# Patient Record
Sex: Male | Born: 1945 | Race: White | Hispanic: No | Marital: Single | Smoking: Current every day smoker
Health system: Southern US, Community
[De-identification: ages and names within clinical notes are randomized; demographics above are authoritative.]

---

## 1998-12-09 HISTORY — PX: HERNIA REPAIR: SHX51

## 2001-02-04 ENCOUNTER — Ambulatory Visit (HOSPITAL_BASED_OUTPATIENT_CLINIC_OR_DEPARTMENT_OTHER): Admission: RE | Admit: 2001-02-04 | Discharge: 2001-02-04 | Payer: Self-pay | Admitting: Surgery

## 2013-09-16 ENCOUNTER — Encounter: Payer: Self-pay | Admitting: Internal Medicine

## 2013-09-16 ENCOUNTER — Ambulatory Visit (INDEPENDENT_AMBULATORY_CARE_PROVIDER_SITE_OTHER): Payer: Medicare Other | Admitting: Internal Medicine

## 2013-09-16 VITALS — BP 140/98 | HR 64 | Ht 70.0 in | Wt 225.0 lb

## 2013-09-16 DIAGNOSIS — Z720 Tobacco use: Secondary | ICD-10-CM

## 2013-09-16 DIAGNOSIS — R5383 Other fatigue: Secondary | ICD-10-CM

## 2013-09-16 DIAGNOSIS — R002 Palpitations: Secondary | ICD-10-CM

## 2013-09-16 DIAGNOSIS — F17200 Nicotine dependence, unspecified, uncomplicated: Secondary | ICD-10-CM

## 2013-09-16 DIAGNOSIS — Z8249 Family history of ischemic heart disease and other diseases of the circulatory system: Secondary | ICD-10-CM

## 2013-09-16 DIAGNOSIS — R0989 Other specified symptoms and signs involving the circulatory and respiratory systems: Secondary | ICD-10-CM

## 2013-09-16 DIAGNOSIS — R5381 Other malaise: Secondary | ICD-10-CM

## 2013-09-16 DIAGNOSIS — E785 Hyperlipidemia, unspecified: Secondary | ICD-10-CM

## 2013-09-16 DIAGNOSIS — R0609 Other forms of dyspnea: Secondary | ICD-10-CM

## 2013-09-16 DIAGNOSIS — R0600 Dyspnea, unspecified: Secondary | ICD-10-CM

## 2013-09-16 NOTE — Patient Instructions (Signed)
Your physician has requested that you have a lexiscan myoview. For further information please visit [email or web address] Please follow instruction sheet, as given.  Please schedule a follow up visit after this test to review the results with Dr. Rennis Golden

## 2013-09-16 NOTE — Progress Notes (Signed)
OFFICE NOTE  Chief Complaint:  Fatigue, DOE  Primary Care Physician: No PCP Per Patient  HPI:  Richard Glover is a pleasant 67 year old male who recently saw Dr. Aleatha Borer at [facility]. He has a history of mild dyslipidemia and palpitations in the past. He has had some trouble with sleep and or depression and was previously taking Elavil. He was also on atenolol and pravastatin. In the past he reports he's had problems with atorvastatin causing muscle pains, and was placed on pravastatin which cause similar symptoms. He has since discontinued that medication. Around that time he is also noticed increasing fatigue and leg heaviness. He also has little energy to get short of breath very easily with little activity. The symptoms have come on over the past 8 months and he's noticed a marked decline. He feels like sleeping all the time and is not interested or motivated to go out to do any of the art work that he wants to do. He was recently seen and found to be in a sinus bradycardia with heart rates in the 50s. He discontinued atenolol and his heart rate has come up slightly into the 60s. In addition he stopped Elavil, but has not noticed any improvement in his symptoms. He has had evaluation of his testosterone levels which were normal. I thoroughly reviewed notes from Dr. Broadus John office including prior workup which was extensive and that included laboratory work, x-rays and an EKG in his office which I personally reviewed. His EKG today which I reviewed and interpreted shows a sinus rhythm at a rate of 64. Mr. Callender continues to smoke but he reports is very little. He does report a family history of heart disease in his father who was reportedly overweight, smoker and age a "country diet".  Despite this he is not describing any chest pain.  PMHx:  History reviewed. No pertinent past medical history.  Past Surgical History  Procedure Laterality Date  . Hernia repair  2000    FAMHx:    Family History  Problem Relation Age of Onset  . Liver cancer Mother   . Heart attack Father   . Cancer Maternal Grandfather     SOCHx:   reports that he has been smoking Cigarettes.  He has a 10 pack-year smoking history. He uses smokeless tobacco. He reports that he drinks alcohol. He reports that he does not use illicit drugs.  ALLERGIES:  Allergies  Allergen Reactions  . Atorvastatin Other (See Comments)    Myalgia   . Pravastatin Sodium Other (See Comments)    myalgia    ROS: A comprehensive review of systems was negative except for: Constitutional: positive for fatigue Respiratory: positive for dyspnea on exertion Cardiovascular: positive for palpitations  HOME MEDS: Current Outpatient Prescriptions  Medication Sig Dispense Refill  . aspirin 81 MG tablet Take 81 mg by mouth daily.      . fish oil-omega-3 fatty acids 1000 MG capsule Take 1 g by mouth daily.      . Multiple Vitamin (MULTIVITAMIN) capsule Take 1 capsule by mouth daily.      Marland Kitchen VITAMIN D, CHOLECALCIFEROL, PO Take by mouth daily.       No current facility-administered medications for this visit.    LABS/IMAGING: No results found for this or any previous visit (from the past 48 hour(s)). No results found.  VITALS: BP 140/98  Pulse 64  Ht 5\' 10"  (1.778 m)  Wt 225 lb (102.059 kg)  BMI 32.28 kg/m2  EXAM: General appearance: alert and no distress Neck: no carotid bruit and no JVD Lungs: clear to auscultation bilaterally Heart: regular rate and rhythm, S1, S2 normal, no murmur, click, rub or gallop Abdomen: soft, non-tender; bowel sounds normal; no masses,  no organomegaly and no pulsatile masses Extremities: extremities normal, atraumatic, no cyanosis or edema Pulses: 2+ and symmetric Skin: Skin color, texture, turgor normal. No rashes or lesions Neurologic: Grossly normal Psych: Mood, affect normal, pleasant, does not appear anxious  EKG: Normal sinus rhythm at 64 with normal axes and  intervals and no ischemic changes  ASSESSMENT: 1. Progressive worsening fatigue 2. Dyspnea on exertion 3. History of dyslipidemia-intolerant to statins 4. Tobacco abuse - not interested in quitting at this time 5. Obesity 6. Palpitations 7. Dyslipidemia- intolerant to statins  PLAN: 1.   Mr. Wigington has progressively worsening fatigue and has had a thorough workup through his primary care physician. The concern was that his heart rate was low in the 50s on atenolol, but that has come up significantly after stopping his beta blocker. He is currently not on medicine for that or for his dyslipidemia. In addition his father had heart disease and had a heart attack at age 53 and died from that. I'm concerned that Mr. Caudell progressive worsening fatigue is due to underlying coronary artery disease. There may be additionally chronotropic incompetence, and now that he is off the beta blocker would also be helpful to see what his exercise capacity is and heart rate response to exercise. I would recommend an exercise nuclear stress test to evaluate for any underlying coronary artery disease.  If this is negative, I feel that we need to continue to work on aggressive risk factor modification, including weight loss, smoking cessation and treatment of his abnormal cholesterol.  Thanks for referring him. I plan to see him back in 2 weeks to discuss the results of his stress test.  Chrystie Nose, MD, [facility] Attending Cardiologist [facility]  Katherin Ramey C 09/16/2013, 12:21 PM

## 2013-09-22 ENCOUNTER — Ambulatory Visit (HOSPITAL_COMMUNITY)
Admission: RE | Admit: 2013-09-22 | Discharge: 2013-09-22 | Disposition: A | Discharge status: Home / Self Care | Payer: Medicare Other | Source: Ambulatory Visit | Attending: Cardiology | Admitting: Cardiology

## 2013-09-22 DIAGNOSIS — R0989 Other specified symptoms and signs involving the circulatory and respiratory systems: Secondary | ICD-10-CM | POA: Insufficient documentation

## 2013-09-22 DIAGNOSIS — R0609 Other forms of dyspnea: Secondary | ICD-10-CM | POA: Insufficient documentation

## 2013-09-22 DIAGNOSIS — R5383 Other fatigue: Secondary | ICD-10-CM

## 2013-09-22 DIAGNOSIS — R0600 Dyspnea, unspecified: Secondary | ICD-10-CM

## 2013-09-22 DIAGNOSIS — F17200 Nicotine dependence, unspecified, uncomplicated: Secondary | ICD-10-CM

## 2013-09-22 DIAGNOSIS — R5381 Other malaise: Secondary | ICD-10-CM | POA: Insufficient documentation

## 2013-09-22 DIAGNOSIS — Z8249 Family history of ischemic heart disease and other diseases of the circulatory system: Secondary | ICD-10-CM | POA: Insufficient documentation

## 2013-09-22 DIAGNOSIS — E785 Hyperlipidemia, unspecified: Secondary | ICD-10-CM | POA: Insufficient documentation

## 2013-09-22 MED ORDER — TECHNETIUM TC 99M SESTAMIBI GENERIC - CARDIOLITE
30.4000 | Freq: Once | INTRAVENOUS | Status: AC | PRN
  Administered 2013-09-22: 30 via INTRAVENOUS

## 2013-09-22 MED ORDER — TECHNETIUM TC 99M SESTAMIBI GENERIC - CARDIOLITE
10.4000 | Freq: Once | INTRAVENOUS | Status: AC | PRN
  Administered 2013-09-22: 10 via INTRAVENOUS

## 2013-09-22 MED ORDER — REGADENOSON 0.4 MG/5ML IV SOLN
0.4000 mg | Freq: Once | INTRAVENOUS | Status: AC
  Administered 2013-09-22: 0.4 mg via INTRAVENOUS

## 2013-09-22 NOTE — Procedures (Addendum)
[facility] [address] [place] [place] [address] [phone]  Cardiology Nuclear Med Study  Richard Glover is a 67 y.o. male     MRN : [id number]     DOB: 1946/10/07  Procedure Date: 09/22/2013  Nuclear Med Background Indication for Stress Test:  Evaluation for Ischemia History:  No prior history reported. Cardiac Risk Factors: Family History - CAD, Lipids, Obesity and Smoker  Symptoms:  DOE, Fatigue, Palpitations, SOB and C/O Bil leg heaviness   Nuclear Pre-Procedure Caffeine/Decaff Intake:  1:00am NPO After: 11 AM   IV Site: R Hand  IV 0.9% NS with Angio Cath:  22g  Chest Size (in):  44"  IV Started by: Emmit Pomfret, RN  Height: 5\' 10"  (1.778 m)  Cup Size: n/a  BMI:  Body mass index is 32.28 kg/(m^2). Weight:  225 lb (102.059 kg)   Tech Comments:  n/a    Nuclear Med Study 1 or 2 day study: 1 day  Stress Test Type:  Lexiscan  Order Authorizing Provider:  Zoila Shutter, MD   Resting Radionuclide: Technetium 46m Sestamibi  Resting Radionuclide Dose: 10.4 mCi   Stress Radionuclide:  Technetium 58m Sestamibi  Stress Radionuclide Dose: 30.4 mCi           Stress Protocol Rest HR: 55 Stress HR: 95  Rest BP: 138/95 Stress BP: 153/94  Exercise Time (min): n/a METS: n/a          Dose of Adenosine (mg):  n/a Dose of Lexiscan: 0.4 mg  Dose of Atropine (mg): n/a Dose of Dobutamine: n/a mcg/kg/min (at max HR)  Stress Test Technologist: Ernestene Mention, CCT Nuclear Technologist: Gonzella Lex, CNMT   Rest Procedure:  Myocardial perfusion imaging was performed at rest 45 minutes following the intravenous administration of Technetium 81m Sestamibi. Stress Procedure:  The patient received IV Lexiscan 0.4 mg over 15-seconds.  Technetium 39m Sestamibi injected at 30-seconds.  There were no significant changes with Lexiscan.  Quantitative spect images were obtained after a 45 minute delay.  Transient Ischemic Dilatation (Normal  <1.22):  1.09 Lung/Heart Ratio (Normal <0.45):  0.35 QGS EDV:  97 ml QGS ESV:  40 ml LV Ejection Fraction: 59%  Signed by       Rest ECG: NSR - Normal EKG  Stress ECG: No significant ST segment change suggestive of ischemia; isolated PVC  QPS Raw Data Images:  Normal; no motion artifact; normal heart/lung ratio. Stress Images:  Normal homogeneous uptake in all areas of the myocardium. Rest Images:  Normal homogeneous uptake in all areas of the myocardium. Subtraction (SDS):  No evidence of ischemia.  Impression Exercise Capacity:  Lexiscan with no exercise. BP Response:  Normal blood pressure response. Clinical Symptoms:  Mild shortness of breath ECG Impression:  No significant ST segment change suggestive of ischemia. Comparison with Prior Nuclear Study: No images to compare  Overall Impression:  Normal stress nuclear study.  LV Wall Motion:  NL LV Function, EF 59%; NL Wall Motion   KELLY,THOMAS A, MD  09/22/2013 5:59 PM

## 2013-09-30 ENCOUNTER — Encounter: Payer: Self-pay | Admitting: Internal Medicine

## 2013-09-30 ENCOUNTER — Ambulatory Visit (INDEPENDENT_AMBULATORY_CARE_PROVIDER_SITE_OTHER): Payer: Medicare Other | Admitting: Internal Medicine

## 2013-09-30 VITALS — BP 130/84 | HR 64 | Ht 70.0 in | Wt 225.4 lb

## 2013-09-30 DIAGNOSIS — R0600 Dyspnea, unspecified: Secondary | ICD-10-CM

## 2013-09-30 DIAGNOSIS — R0609 Other forms of dyspnea: Secondary | ICD-10-CM

## 2013-09-30 DIAGNOSIS — R5383 Other fatigue: Secondary | ICD-10-CM

## 2013-09-30 DIAGNOSIS — E785 Hyperlipidemia, unspecified: Secondary | ICD-10-CM

## 2013-09-30 DIAGNOSIS — R002 Palpitations: Secondary | ICD-10-CM

## 2013-09-30 DIAGNOSIS — F17200 Nicotine dependence, unspecified, uncomplicated: Secondary | ICD-10-CM

## 2013-09-30 DIAGNOSIS — Z720 Tobacco use: Secondary | ICD-10-CM

## 2013-09-30 DIAGNOSIS — R0989 Other specified symptoms and signs involving the circulatory and respiratory systems: Secondary | ICD-10-CM

## 2013-09-30 DIAGNOSIS — R5381 Other malaise: Secondary | ICD-10-CM

## 2013-09-30 NOTE — Progress Notes (Signed)
OFFICE NOTE  Chief Complaint:  Fatigue, DOE  Primary Care Physician: No PCP Per Patient  HPI:  Richard Glover is a pleasant 67 year old male who recently saw Dr. Aleatha Glover at [facility]. He has a history of mild dyslipidemia and palpitations in the past. He has had some trouble with sleep and or depression and was previously taking Elavil. He was also on atenolol and pravastatin. In the past he reports he's had problems with atorvastatin causing muscle pains, and was placed on pravastatin which cause similar symptoms. He has since discontinued that medication. Around that time he is also noticed increasing fatigue and leg heaviness. He also has little energy to get short of breath very easily with little activity. The symptoms have come on over the past 8 months and he's noticed a marked decline. He feels like sleeping all the time and is not interested or motivated to go out to do any of the art work that he wants to do. He was recently seen and found to be in a sinus bradycardia with heart rates in the 50s. He discontinued atenolol and his heart rate has come up slightly into the 60s. In addition he stopped Elavil, but has not noticed any improvement in his symptoms. He has had evaluation of his testosterone levels which were normal. I thoroughly reviewed notes from Dr. Broadus Glover office including prior workup which was extensive and that included laboratory work, x-rays and an EKG in his office which I personally reviewed. His EKG today which I reviewed and interpreted shows a sinus rhythm at a rate of 64. Richard Glover continues to smoke but he reports is very little. He does report a family history of heart disease in his father who was reportedly overweight, smoker and age a "country diet".  Despite this he is not describing any chest pain.  Richard Glover underwent a nuclear stress test on 09/22/2013. This was negative for ischemia and consider low risk. EF was 59%. Since that stress test he started  to use some outdoor activities including sawing some wood with his friend. He was able to activities without market shortness of breath and feels it may be due to the cooler weather. He denies any significant palpitations.  PMHx:  History reviewed. No pertinent past medical history.  Past Surgical History  Procedure Laterality Date  . Hernia repair  2000    FAMHx:  Family History  Problem Relation Age of Onset  . Liver cancer Mother   . Heart attack Father   . Cancer Maternal Grandfather     SOCHx:   reports that he has been smoking Cigarettes.  He has a 10 pack-year smoking history. He uses smokeless tobacco. He reports that he drinks alcohol. He reports that he does not use illicit drugs.  ALLERGIES:  Allergies  Allergen Reactions  . Atorvastatin Other (See Comments)    Myalgia   . Pravastatin Sodium Other (See Comments)    myalgia  . Statins Other (See Comments)    Unspecified     ROS: A comprehensive review of systems was negative except for: Respiratory: positive for dyspnea on exertion  HOME MEDS: Current Outpatient Prescriptions  Medication Sig Dispense Refill  . aspirin 81 MG tablet Take 81 mg by mouth daily.      . fish oil-omega-3 fatty acids 1000 MG capsule Take 1 g by mouth daily.      . Multiple Vitamin (MULTIVITAMIN) capsule Take 1 capsule by mouth daily.      Marland Kitchen  VITAMIN D, CHOLECALCIFEROL, PO Take by mouth daily.       No current facility-administered medications for this visit.    LABS/IMAGING: No results found for this or any previous visit (from the past 48 hour(s)). No results found.  VITALS: BP 130/84  Pulse 64  Ht 5\' 10"  (1.778 m)  Wt 225 lb 6.4 oz (102.241 kg)  BMI 32.34 kg/m2  EXAM: deferred  EKG: Deferred  ASSESSMENT: 1. Progressive worsening fatigue - negative nuclear stress test, symptoms slightly improved 2. Dyspnea on exertion - negative nuclear stress test 3. History of dyslipidemia-intolerant to statins 4. Tobacco abuse  - not interested in quitting at this time 5. Obesity 6. Palpitations 7. Dyslipidemia- intolerant to statins  PLAN: 1.   Richard Glover  had a negative nuclear stress test with a preserved EF. His symptoms are slightly better it may be due to deconditioning or inactivity. I've encouraged him to get more activity and work on smoking cessation. He could have some early COPD and I recommended pulmonary function testing but he was not interested at this time. We'll plan to see him back annually or sooner as necessary.  Thanks again for referring him.  Richard Nose, MD, [facility] Attending Cardiologist [facility]  Richard Glover 09/30/2013, 1:18 PM

## 2013-09-30 NOTE — Patient Instructions (Signed)
Your physician wants you to follow-up in: 1 year. You will receive a reminder letter in the mail two months in advance. If you don't receive a letter, please call our office to schedule the follow-up appointment.  

## 2016-01-25 DIAGNOSIS — I10 Essential (primary) hypertension: Secondary | ICD-10-CM | POA: Diagnosis not present

## 2016-01-25 DIAGNOSIS — H18413 Arcus senilis, bilateral: Secondary | ICD-10-CM | POA: Diagnosis not present

## 2016-01-25 DIAGNOSIS — Z9849 Cataract extraction status, unspecified eye: Secondary | ICD-10-CM | POA: Diagnosis not present

## 2016-01-25 DIAGNOSIS — H35033 Hypertensive retinopathy, bilateral: Secondary | ICD-10-CM | POA: Diagnosis not present

## 2016-01-25 DIAGNOSIS — H11423 Conjunctival edema, bilateral: Secondary | ICD-10-CM | POA: Diagnosis not present

## 2016-01-25 DIAGNOSIS — H43813 Vitreous degeneration, bilateral: Secondary | ICD-10-CM | POA: Diagnosis not present

## 2016-01-25 DIAGNOSIS — H11153 Pinguecula, bilateral: Secondary | ICD-10-CM | POA: Diagnosis not present

## 2016-01-25 DIAGNOSIS — Z961 Presence of intraocular lens: Secondary | ICD-10-CM | POA: Diagnosis not present

## 2016-01-25 DIAGNOSIS — H40023 Open angle with borderline findings, high risk, bilateral: Secondary | ICD-10-CM | POA: Diagnosis not present

## 2016-01-25 DIAGNOSIS — H40003 Preglaucoma, unspecified, bilateral: Secondary | ICD-10-CM | POA: Diagnosis not present

## 2016-03-27 DIAGNOSIS — M545 Low back pain: Secondary | ICD-10-CM | POA: Diagnosis not present

## 2016-03-27 DIAGNOSIS — M9904 Segmental and somatic dysfunction of sacral region: Secondary | ICD-10-CM | POA: Diagnosis not present

## 2016-03-27 DIAGNOSIS — M9903 Segmental and somatic dysfunction of lumbar region: Secondary | ICD-10-CM | POA: Diagnosis not present

## 2016-03-27 DIAGNOSIS — M9905 Segmental and somatic dysfunction of pelvic region: Secondary | ICD-10-CM | POA: Diagnosis not present

## 2016-04-01 DIAGNOSIS — M9905 Segmental and somatic dysfunction of pelvic region: Secondary | ICD-10-CM | POA: Diagnosis not present

## 2016-04-01 DIAGNOSIS — M9904 Segmental and somatic dysfunction of sacral region: Secondary | ICD-10-CM | POA: Diagnosis not present

## 2016-04-01 DIAGNOSIS — M545 Low back pain: Secondary | ICD-10-CM | POA: Diagnosis not present

## 2016-04-01 DIAGNOSIS — M9903 Segmental and somatic dysfunction of lumbar region: Secondary | ICD-10-CM | POA: Diagnosis not present

## 2016-04-02 DIAGNOSIS — M9904 Segmental and somatic dysfunction of sacral region: Secondary | ICD-10-CM | POA: Diagnosis not present

## 2016-04-02 DIAGNOSIS — M9903 Segmental and somatic dysfunction of lumbar region: Secondary | ICD-10-CM | POA: Diagnosis not present

## 2016-04-02 DIAGNOSIS — M9905 Segmental and somatic dysfunction of pelvic region: Secondary | ICD-10-CM | POA: Diagnosis not present

## 2016-04-02 DIAGNOSIS — M545 Low back pain: Secondary | ICD-10-CM | POA: Diagnosis not present

## 2016-04-04 DIAGNOSIS — M545 Low back pain: Secondary | ICD-10-CM | POA: Diagnosis not present

## 2016-04-04 DIAGNOSIS — M9904 Segmental and somatic dysfunction of sacral region: Secondary | ICD-10-CM | POA: Diagnosis not present

## 2016-04-04 DIAGNOSIS — M9903 Segmental and somatic dysfunction of lumbar region: Secondary | ICD-10-CM | POA: Diagnosis not present

## 2016-04-04 DIAGNOSIS — M9905 Segmental and somatic dysfunction of pelvic region: Secondary | ICD-10-CM | POA: Diagnosis not present

## 2016-04-08 DIAGNOSIS — M9904 Segmental and somatic dysfunction of sacral region: Secondary | ICD-10-CM | POA: Diagnosis not present

## 2016-04-08 DIAGNOSIS — M545 Low back pain: Secondary | ICD-10-CM | POA: Diagnosis not present

## 2016-04-08 DIAGNOSIS — M9903 Segmental and somatic dysfunction of lumbar region: Secondary | ICD-10-CM | POA: Diagnosis not present

## 2016-04-08 DIAGNOSIS — M9905 Segmental and somatic dysfunction of pelvic region: Secondary | ICD-10-CM | POA: Diagnosis not present

## 2016-04-09 DIAGNOSIS — M9903 Segmental and somatic dysfunction of lumbar region: Secondary | ICD-10-CM | POA: Diagnosis not present

## 2016-04-09 DIAGNOSIS — M545 Low back pain: Secondary | ICD-10-CM | POA: Diagnosis not present

## 2016-04-09 DIAGNOSIS — M9904 Segmental and somatic dysfunction of sacral region: Secondary | ICD-10-CM | POA: Diagnosis not present

## 2016-04-09 DIAGNOSIS — M9905 Segmental and somatic dysfunction of pelvic region: Secondary | ICD-10-CM | POA: Diagnosis not present

## 2016-04-10 DIAGNOSIS — Z6831 Body mass index (BMI) 31.0-31.9, adult: Secondary | ICD-10-CM | POA: Diagnosis not present

## 2016-04-10 DIAGNOSIS — Z0000 Encounter for general adult medical examination without abnormal findings: Secondary | ICD-10-CM | POA: Diagnosis not present

## 2016-04-11 DIAGNOSIS — M9905 Segmental and somatic dysfunction of pelvic region: Secondary | ICD-10-CM | POA: Diagnosis not present

## 2016-04-11 DIAGNOSIS — M9904 Segmental and somatic dysfunction of sacral region: Secondary | ICD-10-CM | POA: Diagnosis not present

## 2016-04-11 DIAGNOSIS — M9903 Segmental and somatic dysfunction of lumbar region: Secondary | ICD-10-CM | POA: Diagnosis not present

## 2016-04-11 DIAGNOSIS — M545 Low back pain: Secondary | ICD-10-CM | POA: Diagnosis not present

## 2016-04-16 DIAGNOSIS — M9904 Segmental and somatic dysfunction of sacral region: Secondary | ICD-10-CM | POA: Diagnosis not present

## 2016-04-16 DIAGNOSIS — M9903 Segmental and somatic dysfunction of lumbar region: Secondary | ICD-10-CM | POA: Diagnosis not present

## 2016-04-16 DIAGNOSIS — M545 Low back pain: Secondary | ICD-10-CM | POA: Diagnosis not present

## 2016-04-16 DIAGNOSIS — M9905 Segmental and somatic dysfunction of pelvic region: Secondary | ICD-10-CM | POA: Diagnosis not present

## 2016-04-18 DIAGNOSIS — M9903 Segmental and somatic dysfunction of lumbar region: Secondary | ICD-10-CM | POA: Diagnosis not present

## 2016-04-18 DIAGNOSIS — M9904 Segmental and somatic dysfunction of sacral region: Secondary | ICD-10-CM | POA: Diagnosis not present

## 2016-04-18 DIAGNOSIS — M545 Low back pain: Secondary | ICD-10-CM | POA: Diagnosis not present

## 2016-04-18 DIAGNOSIS — M9905 Segmental and somatic dysfunction of pelvic region: Secondary | ICD-10-CM | POA: Diagnosis not present

## 2016-04-23 DIAGNOSIS — M9903 Segmental and somatic dysfunction of lumbar region: Secondary | ICD-10-CM | POA: Diagnosis not present

## 2016-04-23 DIAGNOSIS — M545 Low back pain: Secondary | ICD-10-CM | POA: Diagnosis not present

## 2016-04-23 DIAGNOSIS — M9904 Segmental and somatic dysfunction of sacral region: Secondary | ICD-10-CM | POA: Diagnosis not present

## 2016-04-23 DIAGNOSIS — M9905 Segmental and somatic dysfunction of pelvic region: Secondary | ICD-10-CM | POA: Diagnosis not present

## 2016-04-24 DIAGNOSIS — R69 Illness, unspecified: Secondary | ICD-10-CM | POA: Diagnosis not present

## 2016-04-25 DIAGNOSIS — M9905 Segmental and somatic dysfunction of pelvic region: Secondary | ICD-10-CM | POA: Diagnosis not present

## 2016-04-25 DIAGNOSIS — M9903 Segmental and somatic dysfunction of lumbar region: Secondary | ICD-10-CM | POA: Diagnosis not present

## 2016-04-25 DIAGNOSIS — M9904 Segmental and somatic dysfunction of sacral region: Secondary | ICD-10-CM | POA: Diagnosis not present

## 2016-04-25 DIAGNOSIS — M545 Low back pain: Secondary | ICD-10-CM | POA: Diagnosis not present

## 2016-05-08 DIAGNOSIS — M9903 Segmental and somatic dysfunction of lumbar region: Secondary | ICD-10-CM | POA: Diagnosis not present

## 2016-05-08 DIAGNOSIS — M545 Low back pain: Secondary | ICD-10-CM | POA: Diagnosis not present

## 2016-05-09 DIAGNOSIS — M9903 Segmental and somatic dysfunction of lumbar region: Secondary | ICD-10-CM | POA: Diagnosis not present

## 2016-05-09 DIAGNOSIS — M545 Low back pain: Secondary | ICD-10-CM | POA: Diagnosis not present

## 2016-05-10 DIAGNOSIS — M545 Low back pain: Secondary | ICD-10-CM | POA: Diagnosis not present

## 2016-05-10 DIAGNOSIS — M9903 Segmental and somatic dysfunction of lumbar region: Secondary | ICD-10-CM | POA: Diagnosis not present

## 2016-05-16 DIAGNOSIS — M9903 Segmental and somatic dysfunction of lumbar region: Secondary | ICD-10-CM | POA: Diagnosis not present

## 2016-05-16 DIAGNOSIS — M545 Low back pain: Secondary | ICD-10-CM | POA: Diagnosis not present

## 2016-05-30 DIAGNOSIS — M9903 Segmental and somatic dysfunction of lumbar region: Secondary | ICD-10-CM | POA: Diagnosis not present

## 2016-05-30 DIAGNOSIS — M545 Low back pain: Secondary | ICD-10-CM | POA: Diagnosis not present

## 2016-06-27 DIAGNOSIS — M9903 Segmental and somatic dysfunction of lumbar region: Secondary | ICD-10-CM | POA: Diagnosis not present

## 2016-06-27 DIAGNOSIS — M545 Low back pain: Secondary | ICD-10-CM | POA: Diagnosis not present

## 2016-08-01 DIAGNOSIS — M9903 Segmental and somatic dysfunction of lumbar region: Secondary | ICD-10-CM | POA: Diagnosis not present

## 2016-08-01 DIAGNOSIS — M545 Low back pain: Secondary | ICD-10-CM | POA: Diagnosis not present

## 2016-08-26 DIAGNOSIS — Z0000 Encounter for general adult medical examination without abnormal findings: Secondary | ICD-10-CM | POA: Diagnosis not present

## 2016-08-26 DIAGNOSIS — E784 Other hyperlipidemia: Secondary | ICD-10-CM | POA: Diagnosis not present

## 2016-08-26 DIAGNOSIS — Z125 Encounter for screening for malignant neoplasm of prostate: Secondary | ICD-10-CM | POA: Diagnosis not present

## 2016-09-02 DIAGNOSIS — E785 Hyperlipidemia, unspecified: Secondary | ICD-10-CM | POA: Diagnosis not present

## 2016-09-02 DIAGNOSIS — Z1389 Encounter for screening for other disorder: Secondary | ICD-10-CM | POA: Diagnosis not present

## 2016-09-02 DIAGNOSIS — Z23 Encounter for immunization: Secondary | ICD-10-CM | POA: Diagnosis not present

## 2016-09-02 DIAGNOSIS — Z86010 Personal history of colonic polyps: Secondary | ICD-10-CM | POA: Diagnosis not present

## 2016-09-02 DIAGNOSIS — Z1212 Encounter for screening for malignant neoplasm of rectum: Secondary | ICD-10-CM | POA: Diagnosis not present

## 2016-09-02 DIAGNOSIS — Z0000 Encounter for general adult medical examination without abnormal findings: Secondary | ICD-10-CM | POA: Diagnosis not present

## 2016-09-02 DIAGNOSIS — Z6831 Body mass index (BMI) 31.0-31.9, adult: Secondary | ICD-10-CM | POA: Diagnosis not present

## 2016-09-04 DIAGNOSIS — M9903 Segmental and somatic dysfunction of lumbar region: Secondary | ICD-10-CM | POA: Diagnosis not present

## 2016-09-04 DIAGNOSIS — M545 Low back pain: Secondary | ICD-10-CM | POA: Diagnosis not present

## 2016-10-01 DIAGNOSIS — M545 Low back pain: Secondary | ICD-10-CM | POA: Diagnosis not present

## 2016-10-01 DIAGNOSIS — M9903 Segmental and somatic dysfunction of lumbar region: Secondary | ICD-10-CM | POA: Diagnosis not present

## 2016-11-08 DIAGNOSIS — M545 Low back pain: Secondary | ICD-10-CM | POA: Diagnosis not present

## 2016-11-08 DIAGNOSIS — M9903 Segmental and somatic dysfunction of lumbar region: Secondary | ICD-10-CM | POA: Diagnosis not present

## 2016-11-13 DIAGNOSIS — R69 Illness, unspecified: Secondary | ICD-10-CM | POA: Diagnosis not present

## 2016-11-26 DIAGNOSIS — M9903 Segmental and somatic dysfunction of lumbar region: Secondary | ICD-10-CM | POA: Diagnosis not present

## 2016-11-26 DIAGNOSIS — M545 Low back pain: Secondary | ICD-10-CM | POA: Diagnosis not present

## 2016-11-28 DIAGNOSIS — M9903 Segmental and somatic dysfunction of lumbar region: Secondary | ICD-10-CM | POA: Diagnosis not present

## 2016-11-28 DIAGNOSIS — M545 Low back pain: Secondary | ICD-10-CM | POA: Diagnosis not present

## 2016-12-16 DIAGNOSIS — M9903 Segmental and somatic dysfunction of lumbar region: Secondary | ICD-10-CM | POA: Diagnosis not present

## 2016-12-16 DIAGNOSIS — M545 Low back pain: Secondary | ICD-10-CM | POA: Diagnosis not present

## 2017-01-13 DIAGNOSIS — R69 Illness, unspecified: Secondary | ICD-10-CM | POA: Diagnosis not present

## 2017-01-13 DIAGNOSIS — E669 Obesity, unspecified: Secondary | ICD-10-CM | POA: Diagnosis not present

## 2017-01-13 DIAGNOSIS — Z0000 Encounter for general adult medical examination without abnormal findings: Secondary | ICD-10-CM | POA: Diagnosis not present

## 2017-01-13 DIAGNOSIS — Z79899 Other long term (current) drug therapy: Secondary | ICD-10-CM | POA: Diagnosis not present

## 2017-01-13 DIAGNOSIS — K229 Disease of esophagus, unspecified: Secondary | ICD-10-CM | POA: Diagnosis not present

## 2017-01-13 DIAGNOSIS — Z6831 Body mass index (BMI) 31.0-31.9, adult: Secondary | ICD-10-CM | POA: Diagnosis not present

## 2017-01-13 DIAGNOSIS — R590 Localized enlarged lymph nodes: Secondary | ICD-10-CM | POA: Diagnosis not present

## 2017-01-13 DIAGNOSIS — M545 Low back pain: Secondary | ICD-10-CM | POA: Diagnosis not present

## 2017-01-13 DIAGNOSIS — R002 Palpitations: Secondary | ICD-10-CM | POA: Diagnosis not present

## 2017-01-15 DIAGNOSIS — Z6831 Body mass index (BMI) 31.0-31.9, adult: Secondary | ICD-10-CM | POA: Diagnosis not present

## 2017-01-15 DIAGNOSIS — R221 Localized swelling, mass and lump, neck: Secondary | ICD-10-CM | POA: Diagnosis not present

## 2017-01-16 DIAGNOSIS — M9903 Segmental and somatic dysfunction of lumbar region: Secondary | ICD-10-CM | POA: Diagnosis not present

## 2017-01-16 DIAGNOSIS — M545 Low back pain: Secondary | ICD-10-CM | POA: Diagnosis not present

## 2017-01-16 DIAGNOSIS — M9904 Segmental and somatic dysfunction of sacral region: Secondary | ICD-10-CM | POA: Diagnosis not present

## 2017-01-16 DIAGNOSIS — M9905 Segmental and somatic dysfunction of pelvic region: Secondary | ICD-10-CM | POA: Diagnosis not present

## 2017-01-23 DIAGNOSIS — H04123 Dry eye syndrome of bilateral lacrimal glands: Secondary | ICD-10-CM | POA: Diagnosis not present

## 2017-01-23 DIAGNOSIS — I10 Essential (primary) hypertension: Secondary | ICD-10-CM | POA: Diagnosis not present

## 2017-01-23 DIAGNOSIS — H43813 Vitreous degeneration, bilateral: Secondary | ICD-10-CM | POA: Diagnosis not present

## 2017-01-23 DIAGNOSIS — H35033 Hypertensive retinopathy, bilateral: Secondary | ICD-10-CM | POA: Diagnosis not present

## 2017-01-23 DIAGNOSIS — H40003 Preglaucoma, unspecified, bilateral: Secondary | ICD-10-CM | POA: Diagnosis not present

## 2017-01-23 DIAGNOSIS — Z9849 Cataract extraction status, unspecified eye: Secondary | ICD-10-CM | POA: Diagnosis not present

## 2017-01-23 DIAGNOSIS — H52221 Regular astigmatism, right eye: Secondary | ICD-10-CM | POA: Diagnosis not present

## 2017-01-23 DIAGNOSIS — Z961 Presence of intraocular lens: Secondary | ICD-10-CM | POA: Diagnosis not present

## 2017-01-23 DIAGNOSIS — H40013 Open angle with borderline findings, low risk, bilateral: Secondary | ICD-10-CM | POA: Diagnosis not present

## 2017-01-23 DIAGNOSIS — H18413 Arcus senilis, bilateral: Secondary | ICD-10-CM | POA: Diagnosis not present

## 2017-02-13 DIAGNOSIS — M9903 Segmental and somatic dysfunction of lumbar region: Secondary | ICD-10-CM | POA: Diagnosis not present

## 2017-02-13 DIAGNOSIS — M9904 Segmental and somatic dysfunction of sacral region: Secondary | ICD-10-CM | POA: Diagnosis not present

## 2017-02-13 DIAGNOSIS — M9905 Segmental and somatic dysfunction of pelvic region: Secondary | ICD-10-CM | POA: Diagnosis not present

## 2017-02-13 DIAGNOSIS — M545 Low back pain: Secondary | ICD-10-CM | POA: Diagnosis not present

## 2017-03-18 DIAGNOSIS — M25561 Pain in right knee: Secondary | ICD-10-CM | POA: Diagnosis not present

## 2017-03-18 DIAGNOSIS — Z6830 Body mass index (BMI) 30.0-30.9, adult: Secondary | ICD-10-CM | POA: Diagnosis not present

## 2017-04-09 DIAGNOSIS — M9903 Segmental and somatic dysfunction of lumbar region: Secondary | ICD-10-CM | POA: Diagnosis not present

## 2017-04-09 DIAGNOSIS — M9905 Segmental and somatic dysfunction of pelvic region: Secondary | ICD-10-CM | POA: Diagnosis not present

## 2017-04-09 DIAGNOSIS — M545 Low back pain: Secondary | ICD-10-CM | POA: Diagnosis not present

## 2017-04-09 DIAGNOSIS — M9904 Segmental and somatic dysfunction of sacral region: Secondary | ICD-10-CM | POA: Diagnosis not present

## 2017-05-26 DIAGNOSIS — R69 Illness, unspecified: Secondary | ICD-10-CM | POA: Diagnosis not present

## 2017-06-03 DIAGNOSIS — R69 Illness, unspecified: Secondary | ICD-10-CM | POA: Diagnosis not present

## 2017-08-27 DIAGNOSIS — E784 Other hyperlipidemia: Secondary | ICD-10-CM | POA: Diagnosis not present

## 2017-08-27 DIAGNOSIS — Z125 Encounter for screening for malignant neoplasm of prostate: Secondary | ICD-10-CM | POA: Diagnosis not present

## 2017-09-08 ENCOUNTER — Other Ambulatory Visit: Payer: Self-pay | Admitting: Dermatology

## 2017-09-08 DIAGNOSIS — L281 Prurigo nodularis: Secondary | ICD-10-CM | POA: Diagnosis not present

## 2017-09-08 DIAGNOSIS — D229 Melanocytic nevi, unspecified: Secondary | ICD-10-CM | POA: Diagnosis not present

## 2017-09-08 DIAGNOSIS — H0019 Chalazion unspecified eye, unspecified eyelid: Secondary | ICD-10-CM | POA: Diagnosis not present

## 2017-09-08 DIAGNOSIS — D492 Neoplasm of unspecified behavior of bone, soft tissue, and skin: Secondary | ICD-10-CM | POA: Diagnosis not present

## 2017-09-08 DIAGNOSIS — L570 Actinic keratosis: Secondary | ICD-10-CM | POA: Diagnosis not present

## 2017-09-10 DIAGNOSIS — Z1389 Encounter for screening for other disorder: Secondary | ICD-10-CM | POA: Diagnosis not present

## 2017-09-10 DIAGNOSIS — E7849 Other hyperlipidemia: Secondary | ICD-10-CM | POA: Diagnosis not present

## 2017-09-10 DIAGNOSIS — Z23 Encounter for immunization: Secondary | ICD-10-CM | POA: Diagnosis not present

## 2017-09-10 DIAGNOSIS — Z6830 Body mass index (BMI) 30.0-30.9, adult: Secondary | ICD-10-CM | POA: Diagnosis not present

## 2017-09-10 DIAGNOSIS — Z0000 Encounter for general adult medical examination without abnormal findings: Secondary | ICD-10-CM | POA: Diagnosis not present

## 2017-09-10 DIAGNOSIS — M25561 Pain in right knee: Secondary | ICD-10-CM | POA: Diagnosis not present

## 2017-09-15 DIAGNOSIS — H0015 Chalazion left lower eyelid: Secondary | ICD-10-CM | POA: Diagnosis not present

## 2017-09-15 DIAGNOSIS — Z961 Presence of intraocular lens: Secondary | ICD-10-CM | POA: Diagnosis not present

## 2017-09-15 DIAGNOSIS — H0014 Chalazion left upper eyelid: Secondary | ICD-10-CM | POA: Diagnosis not present

## 2017-09-15 DIAGNOSIS — H04123 Dry eye syndrome of bilateral lacrimal glands: Secondary | ICD-10-CM | POA: Diagnosis not present

## 2017-09-19 DIAGNOSIS — Z1212 Encounter for screening for malignant neoplasm of rectum: Secondary | ICD-10-CM | POA: Diagnosis not present

## 2017-11-26 DIAGNOSIS — R69 Illness, unspecified: Secondary | ICD-10-CM | POA: Diagnosis not present

## 2017-12-25 DIAGNOSIS — H18413 Arcus senilis, bilateral: Secondary | ICD-10-CM | POA: Diagnosis not present

## 2017-12-25 DIAGNOSIS — H11423 Conjunctival edema, bilateral: Secondary | ICD-10-CM | POA: Diagnosis not present

## 2017-12-25 DIAGNOSIS — Z9849 Cataract extraction status, unspecified eye: Secondary | ICD-10-CM | POA: Diagnosis not present

## 2017-12-25 DIAGNOSIS — H00011 Hordeolum externum right upper eyelid: Secondary | ICD-10-CM | POA: Diagnosis not present

## 2017-12-25 DIAGNOSIS — Z961 Presence of intraocular lens: Secondary | ICD-10-CM | POA: Diagnosis not present

## 2017-12-25 DIAGNOSIS — H04123 Dry eye syndrome of bilateral lacrimal glands: Secondary | ICD-10-CM | POA: Diagnosis not present

## 2017-12-25 DIAGNOSIS — H11153 Pinguecula, bilateral: Secondary | ICD-10-CM | POA: Diagnosis not present

## 2018-01-07 DIAGNOSIS — M25561 Pain in right knee: Secondary | ICD-10-CM | POA: Diagnosis not present

## 2018-01-07 DIAGNOSIS — M25562 Pain in left knee: Secondary | ICD-10-CM | POA: Diagnosis not present

## 2018-02-05 DIAGNOSIS — H43813 Vitreous degeneration, bilateral: Secondary | ICD-10-CM | POA: Diagnosis not present

## 2018-02-05 DIAGNOSIS — H5211 Myopia, right eye: Secondary | ICD-10-CM | POA: Diagnosis not present

## 2018-02-05 DIAGNOSIS — Z961 Presence of intraocular lens: Secondary | ICD-10-CM | POA: Diagnosis not present

## 2018-02-05 DIAGNOSIS — Z9849 Cataract extraction status, unspecified eye: Secondary | ICD-10-CM | POA: Diagnosis not present

## 2018-02-05 DIAGNOSIS — H35033 Hypertensive retinopathy, bilateral: Secondary | ICD-10-CM | POA: Diagnosis not present

## 2018-02-05 DIAGNOSIS — H11423 Conjunctival edema, bilateral: Secondary | ICD-10-CM | POA: Diagnosis not present

## 2018-02-05 DIAGNOSIS — H18413 Arcus senilis, bilateral: Secondary | ICD-10-CM | POA: Diagnosis not present

## 2018-02-05 DIAGNOSIS — H40013 Open angle with borderline findings, low risk, bilateral: Secondary | ICD-10-CM | POA: Diagnosis not present

## 2018-02-05 DIAGNOSIS — H04123 Dry eye syndrome of bilateral lacrimal glands: Secondary | ICD-10-CM | POA: Diagnosis not present

## 2018-02-05 DIAGNOSIS — H11153 Pinguecula, bilateral: Secondary | ICD-10-CM | POA: Diagnosis not present

## 2018-02-25 DIAGNOSIS — M9903 Segmental and somatic dysfunction of lumbar region: Secondary | ICD-10-CM | POA: Diagnosis not present

## 2018-02-25 DIAGNOSIS — M545 Low back pain: Secondary | ICD-10-CM | POA: Diagnosis not present

## 2018-02-25 DIAGNOSIS — M9902 Segmental and somatic dysfunction of thoracic region: Secondary | ICD-10-CM | POA: Diagnosis not present

## 2018-02-25 DIAGNOSIS — M9901 Segmental and somatic dysfunction of cervical region: Secondary | ICD-10-CM | POA: Diagnosis not present

## 2018-05-21 DIAGNOSIS — R69 Illness, unspecified: Secondary | ICD-10-CM | POA: Diagnosis not present

## 2018-06-09 DIAGNOSIS — Z800 Family history of malignant neoplasm of digestive organs: Secondary | ICD-10-CM | POA: Diagnosis not present

## 2018-06-09 DIAGNOSIS — K648 Other hemorrhoids: Secondary | ICD-10-CM | POA: Diagnosis not present

## 2018-06-09 DIAGNOSIS — Z1211 Encounter for screening for malignant neoplasm of colon: Secondary | ICD-10-CM | POA: Diagnosis not present

## 2018-06-09 DIAGNOSIS — Z86010 Personal history of colonic polyps: Secondary | ICD-10-CM | POA: Diagnosis not present

## 2018-07-06 DIAGNOSIS — W57XXXD Bitten or stung by nonvenomous insect and other nonvenomous arthropods, subsequent encounter: Secondary | ICD-10-CM | POA: Diagnosis not present

## 2018-07-06 DIAGNOSIS — R197 Diarrhea, unspecified: Secondary | ICD-10-CM | POA: Diagnosis not present

## 2018-07-06 DIAGNOSIS — Z6830 Body mass index (BMI) 30.0-30.9, adult: Secondary | ICD-10-CM | POA: Diagnosis not present

## 2018-07-07 DIAGNOSIS — R197 Diarrhea, unspecified: Secondary | ICD-10-CM | POA: Diagnosis not present

## 2018-09-23 DIAGNOSIS — R82998 Other abnormal findings in urine: Secondary | ICD-10-CM | POA: Diagnosis not present

## 2018-09-23 DIAGNOSIS — E7849 Other hyperlipidemia: Secondary | ICD-10-CM | POA: Diagnosis not present

## 2018-09-23 DIAGNOSIS — Z125 Encounter for screening for malignant neoplasm of prostate: Secondary | ICD-10-CM | POA: Diagnosis not present

## 2018-09-30 DIAGNOSIS — Z6829 Body mass index (BMI) 29.0-29.9, adult: Secondary | ICD-10-CM | POA: Diagnosis not present

## 2018-09-30 DIAGNOSIS — R69 Illness, unspecified: Secondary | ICD-10-CM | POA: Diagnosis not present

## 2018-09-30 DIAGNOSIS — Z1389 Encounter for screening for other disorder: Secondary | ICD-10-CM | POA: Diagnosis not present

## 2018-09-30 DIAGNOSIS — E7849 Other hyperlipidemia: Secondary | ICD-10-CM | POA: Diagnosis not present

## 2018-09-30 DIAGNOSIS — Z23 Encounter for immunization: Secondary | ICD-10-CM | POA: Diagnosis not present

## 2018-09-30 DIAGNOSIS — Z86010 Personal history of colonic polyps: Secondary | ICD-10-CM | POA: Diagnosis not present

## 2018-09-30 DIAGNOSIS — R197 Diarrhea, unspecified: Secondary | ICD-10-CM | POA: Diagnosis not present

## 2018-09-30 DIAGNOSIS — Z0000 Encounter for general adult medical examination without abnormal findings: Secondary | ICD-10-CM | POA: Diagnosis not present

## 2018-10-02 ENCOUNTER — Other Ambulatory Visit: Payer: Self-pay | Admitting: Internal Medicine

## 2018-10-02 DIAGNOSIS — Z1212 Encounter for screening for malignant neoplasm of rectum: Secondary | ICD-10-CM | POA: Diagnosis not present

## 2018-10-02 DIAGNOSIS — Z720 Tobacco use: Secondary | ICD-10-CM

## 2018-10-02 DIAGNOSIS — R198 Other specified symptoms and signs involving the digestive system and abdomen: Secondary | ICD-10-CM | POA: Diagnosis not present

## 2018-10-02 DIAGNOSIS — R1032 Left lower quadrant pain: Secondary | ICD-10-CM | POA: Diagnosis not present

## 2018-10-02 DIAGNOSIS — R197 Diarrhea, unspecified: Secondary | ICD-10-CM | POA: Diagnosis not present

## 2018-10-09 ENCOUNTER — Other Ambulatory Visit: Payer: Self-pay | Admitting: Internal Medicine

## 2018-10-09 ENCOUNTER — Ambulatory Visit
Admission: RE | Admit: 2018-10-09 | Discharge: 2018-10-09 | Disposition: A | Discharge status: Home / Self Care | Payer: Medicare Other | Source: Ambulatory Visit | Attending: Internal Medicine | Admitting: Internal Medicine

## 2018-10-09 ENCOUNTER — Ambulatory Visit
Admission: RE | Admit: 2018-10-09 | Discharge: 2018-10-09 | Disposition: A | Discharge status: Home / Self Care | Payer: Medicare HMO | Source: Ambulatory Visit | Attending: Internal Medicine | Admitting: Internal Medicine

## 2018-10-09 DIAGNOSIS — Z136 Encounter for screening for cardiovascular disorders: Secondary | ICD-10-CM | POA: Diagnosis not present

## 2018-10-09 DIAGNOSIS — R69 Illness, unspecified: Secondary | ICD-10-CM | POA: Diagnosis not present

## 2018-10-09 DIAGNOSIS — Z720 Tobacco use: Secondary | ICD-10-CM | POA: Diagnosis not present

## 2018-10-09 DIAGNOSIS — I714 Abdominal aortic aneurysm, without rupture: Secondary | ICD-10-CM | POA: Diagnosis not present

## 2018-10-12 DIAGNOSIS — R198 Other specified symptoms and signs involving the digestive system and abdomen: Secondary | ICD-10-CM | POA: Diagnosis not present

## 2018-10-12 DIAGNOSIS — K769 Liver disease, unspecified: Secondary | ICD-10-CM | POA: Diagnosis not present

## 2018-10-12 DIAGNOSIS — R1032 Left lower quadrant pain: Secondary | ICD-10-CM | POA: Diagnosis not present

## 2018-10-12 DIAGNOSIS — R197 Diarrhea, unspecified: Secondary | ICD-10-CM | POA: Diagnosis not present

## 2019-02-05 DIAGNOSIS — H40013 Open angle with borderline findings, low risk, bilateral: Secondary | ICD-10-CM | POA: Diagnosis not present

## 2019-02-18 DIAGNOSIS — M9902 Segmental and somatic dysfunction of thoracic region: Secondary | ICD-10-CM | POA: Diagnosis not present

## 2019-02-18 DIAGNOSIS — M9901 Segmental and somatic dysfunction of cervical region: Secondary | ICD-10-CM | POA: Diagnosis not present

## 2019-02-18 DIAGNOSIS — M9904 Segmental and somatic dysfunction of sacral region: Secondary | ICD-10-CM | POA: Diagnosis not present

## 2019-02-18 DIAGNOSIS — M9903 Segmental and somatic dysfunction of lumbar region: Secondary | ICD-10-CM | POA: Diagnosis not present

## 2019-02-19 DIAGNOSIS — M9902 Segmental and somatic dysfunction of thoracic region: Secondary | ICD-10-CM | POA: Diagnosis not present

## 2019-02-19 DIAGNOSIS — M9904 Segmental and somatic dysfunction of sacral region: Secondary | ICD-10-CM | POA: Diagnosis not present

## 2019-02-19 DIAGNOSIS — M9901 Segmental and somatic dysfunction of cervical region: Secondary | ICD-10-CM | POA: Diagnosis not present

## 2019-02-19 DIAGNOSIS — M9903 Segmental and somatic dysfunction of lumbar region: Secondary | ICD-10-CM | POA: Diagnosis not present

## 2019-02-22 DIAGNOSIS — M9903 Segmental and somatic dysfunction of lumbar region: Secondary | ICD-10-CM | POA: Diagnosis not present

## 2019-02-22 DIAGNOSIS — M9904 Segmental and somatic dysfunction of sacral region: Secondary | ICD-10-CM | POA: Diagnosis not present

## 2019-02-22 DIAGNOSIS — M9901 Segmental and somatic dysfunction of cervical region: Secondary | ICD-10-CM | POA: Diagnosis not present

## 2019-02-22 DIAGNOSIS — M9902 Segmental and somatic dysfunction of thoracic region: Secondary | ICD-10-CM | POA: Diagnosis not present

## 2019-02-24 DIAGNOSIS — M9902 Segmental and somatic dysfunction of thoracic region: Secondary | ICD-10-CM | POA: Diagnosis not present

## 2019-02-24 DIAGNOSIS — M9904 Segmental and somatic dysfunction of sacral region: Secondary | ICD-10-CM | POA: Diagnosis not present

## 2019-02-24 DIAGNOSIS — M9901 Segmental and somatic dysfunction of cervical region: Secondary | ICD-10-CM | POA: Diagnosis not present

## 2019-02-24 DIAGNOSIS — M9903 Segmental and somatic dysfunction of lumbar region: Secondary | ICD-10-CM | POA: Diagnosis not present

## 2019-02-26 DIAGNOSIS — M9903 Segmental and somatic dysfunction of lumbar region: Secondary | ICD-10-CM | POA: Diagnosis not present

## 2019-02-26 DIAGNOSIS — M9904 Segmental and somatic dysfunction of sacral region: Secondary | ICD-10-CM | POA: Diagnosis not present

## 2019-02-26 DIAGNOSIS — M9902 Segmental and somatic dysfunction of thoracic region: Secondary | ICD-10-CM | POA: Diagnosis not present

## 2019-02-26 DIAGNOSIS — M9901 Segmental and somatic dysfunction of cervical region: Secondary | ICD-10-CM | POA: Diagnosis not present

## 2019-03-01 DIAGNOSIS — M9903 Segmental and somatic dysfunction of lumbar region: Secondary | ICD-10-CM | POA: Diagnosis not present

## 2019-03-01 DIAGNOSIS — M9901 Segmental and somatic dysfunction of cervical region: Secondary | ICD-10-CM | POA: Diagnosis not present

## 2019-03-01 DIAGNOSIS — M9902 Segmental and somatic dysfunction of thoracic region: Secondary | ICD-10-CM | POA: Diagnosis not present

## 2019-03-01 DIAGNOSIS — M9904 Segmental and somatic dysfunction of sacral region: Secondary | ICD-10-CM | POA: Diagnosis not present

## 2019-03-03 DIAGNOSIS — M9901 Segmental and somatic dysfunction of cervical region: Secondary | ICD-10-CM | POA: Diagnosis not present

## 2019-03-03 DIAGNOSIS — M9902 Segmental and somatic dysfunction of thoracic region: Secondary | ICD-10-CM | POA: Diagnosis not present

## 2019-03-03 DIAGNOSIS — M9903 Segmental and somatic dysfunction of lumbar region: Secondary | ICD-10-CM | POA: Diagnosis not present

## 2019-03-03 DIAGNOSIS — M9904 Segmental and somatic dysfunction of sacral region: Secondary | ICD-10-CM | POA: Diagnosis not present

## 2019-03-05 DIAGNOSIS — M9903 Segmental and somatic dysfunction of lumbar region: Secondary | ICD-10-CM | POA: Diagnosis not present

## 2019-03-05 DIAGNOSIS — M9902 Segmental and somatic dysfunction of thoracic region: Secondary | ICD-10-CM | POA: Diagnosis not present

## 2019-03-05 DIAGNOSIS — M9904 Segmental and somatic dysfunction of sacral region: Secondary | ICD-10-CM | POA: Diagnosis not present

## 2019-03-05 DIAGNOSIS — M9901 Segmental and somatic dysfunction of cervical region: Secondary | ICD-10-CM | POA: Diagnosis not present

## 2019-09-27 DIAGNOSIS — Z125 Encounter for screening for malignant neoplasm of prostate: Secondary | ICD-10-CM | POA: Diagnosis not present

## 2019-09-27 DIAGNOSIS — E7849 Other hyperlipidemia: Secondary | ICD-10-CM | POA: Diagnosis not present

## 2019-10-08 DIAGNOSIS — Z86010 Personal history of colonic polyps: Secondary | ICD-10-CM | POA: Diagnosis not present

## 2019-10-08 DIAGNOSIS — R3915 Urgency of urination: Secondary | ICD-10-CM | POA: Diagnosis not present

## 2019-10-08 DIAGNOSIS — Z0000 Encounter for general adult medical examination without abnormal findings: Secondary | ICD-10-CM | POA: Diagnosis not present

## 2019-10-08 DIAGNOSIS — Z23 Encounter for immunization: Secondary | ICD-10-CM | POA: Diagnosis not present

## 2019-10-08 DIAGNOSIS — W57XXXA Bitten or stung by nonvenomous insect and other nonvenomous arthropods, initial encounter: Secondary | ICD-10-CM | POA: Diagnosis not present

## 2019-10-08 DIAGNOSIS — Z1331 Encounter for screening for depression: Secondary | ICD-10-CM | POA: Diagnosis not present

## 2019-10-08 DIAGNOSIS — R69 Illness, unspecified: Secondary | ICD-10-CM | POA: Diagnosis not present

## 2019-10-08 DIAGNOSIS — E785 Hyperlipidemia, unspecified: Secondary | ICD-10-CM | POA: Diagnosis not present

## 2019-10-12 DIAGNOSIS — Z1212 Encounter for screening for malignant neoplasm of rectum: Secondary | ICD-10-CM | POA: Diagnosis not present

## 2019-10-14 ENCOUNTER — Other Ambulatory Visit: Payer: Self-pay | Admitting: Internal Medicine

## 2019-10-14 DIAGNOSIS — Z720 Tobacco use: Secondary | ICD-10-CM

## 2019-10-16 DIAGNOSIS — H1089 Other conjunctivitis: Secondary | ICD-10-CM | POA: Diagnosis not present

## 2019-10-18 DIAGNOSIS — Z961 Presence of intraocular lens: Secondary | ICD-10-CM | POA: Diagnosis not present

## 2019-10-18 DIAGNOSIS — S0500XA Injury of conjunctiva and corneal abrasion without foreign body, unspecified eye, initial encounter: Secondary | ICD-10-CM | POA: Diagnosis not present

## 2019-10-28 ENCOUNTER — Other Ambulatory Visit: Payer: Self-pay | Admitting: Internal Medicine

## 2019-11-01 ENCOUNTER — Ambulatory Visit
Admission: RE | Admit: 2019-11-01 | Discharge: 2019-11-01 | Disposition: A | Discharge status: Home / Self Care | Payer: Medicare HMO | Source: Ambulatory Visit | Attending: Internal Medicine | Admitting: Internal Medicine

## 2019-11-01 DIAGNOSIS — Z720 Tobacco use: Secondary | ICD-10-CM

## 2019-11-01 DIAGNOSIS — R69 Illness, unspecified: Secondary | ICD-10-CM | POA: Diagnosis not present

## 2020-02-03 IMAGING — CT CT CHEST LUNG CANCER SCREENING LOW DOSE W/O CM
2 of 5 series · 15 of 40 positions shown, 18 images · non-contrast
Comparison: Low-dose lung cancer screening 10/09/2018 chest CT.

CLINICAL DATA: 73-year-old male current smoker with 55 pack-year
history of smoking. Lung cancer screening examination.

EXAM:
CT CHEST WITHOUT CONTRAST LOW-DOSE FOR LUNG CANCER SCREENING
TECHNIQUE: Multidetector CT imaging of the chest was performed following the
standard protocol without IV contrast.

[Series 4: lung 1.00 br44 cor · coronal · 0.68mm/px · 3 of 330 slices shown]
[im 66/330  lung]
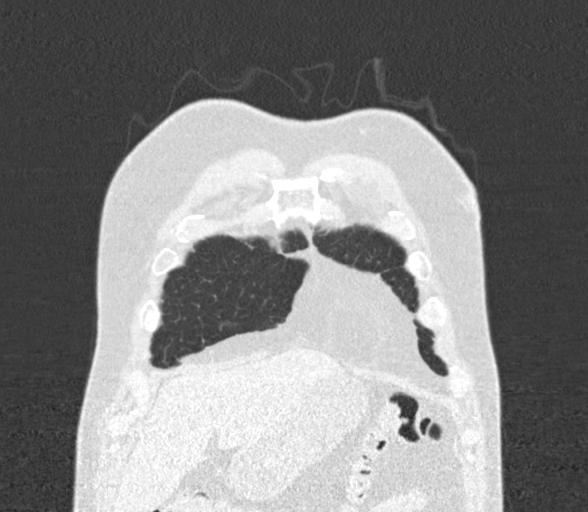
[im 132/330  lung]
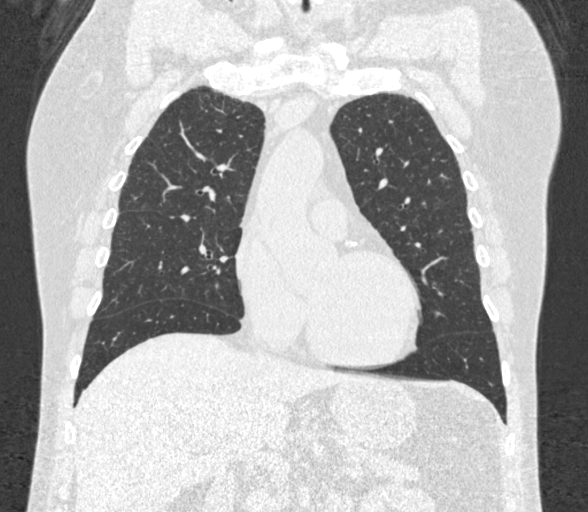
[im 198/330  lung]
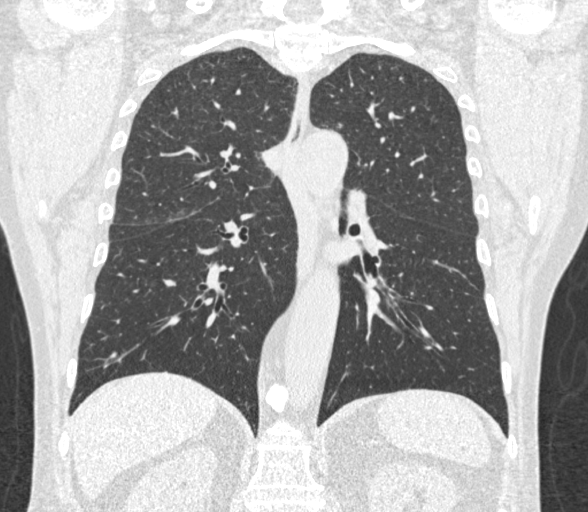

[Series 9: lung 1.00 br60 axial · axial · 0.78mm/px · z∈[-1183,-867]mm · 12 of 348 slices shown, 15 images]
[im 16/348  mediastinal]
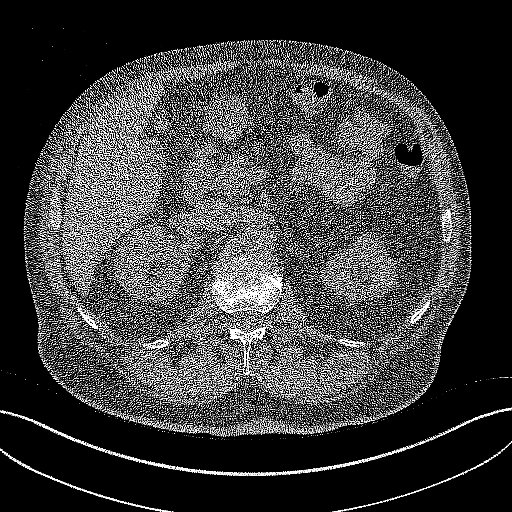
[im 16/348  lung]
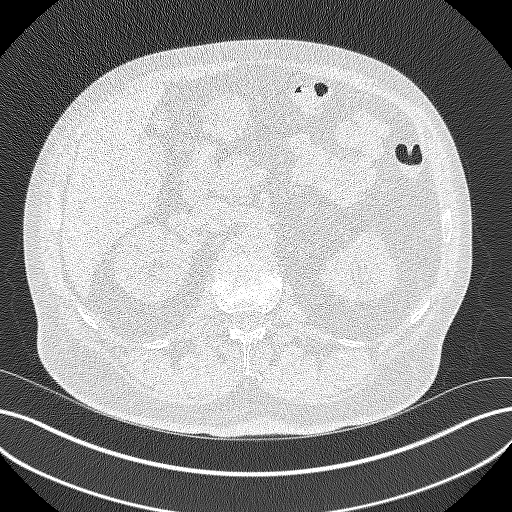
[im 48/348  lung]
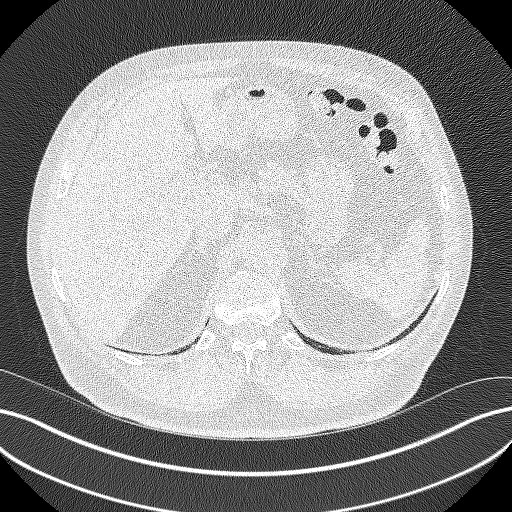
[im 79/348  lung]
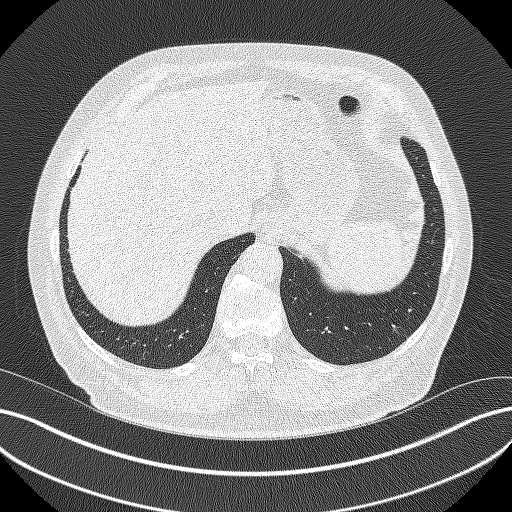
[im 111/348  lung]
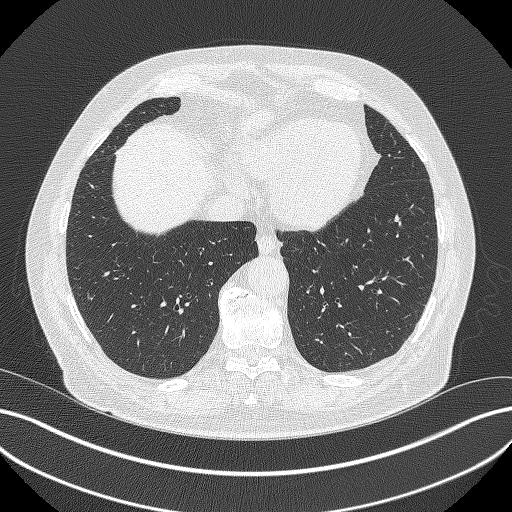
[im 127/348  mediastinal]
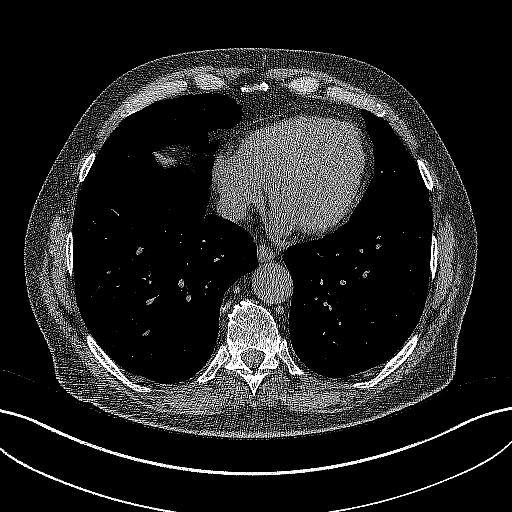
[im 127/348  lung]
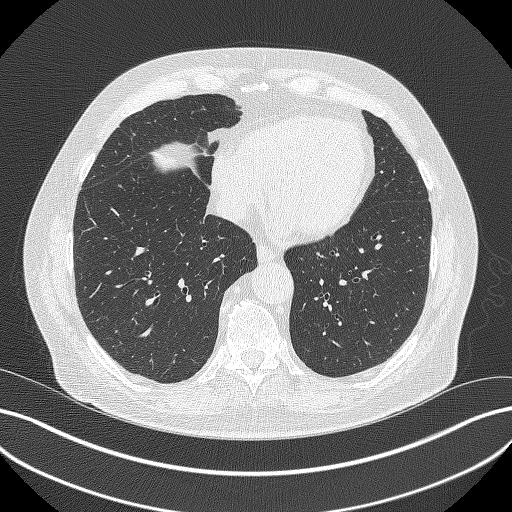
[im 158/348  lung]
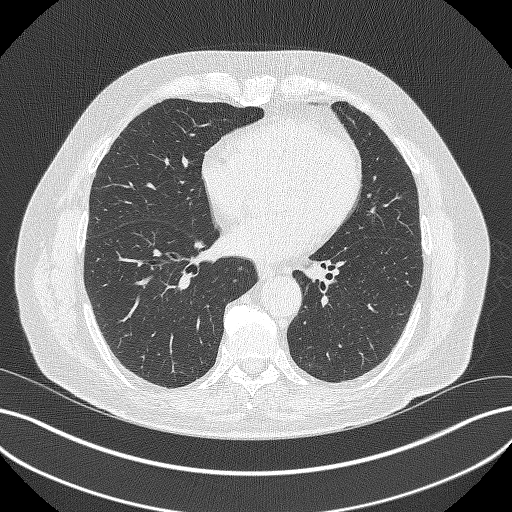
[im 190/348  lung]
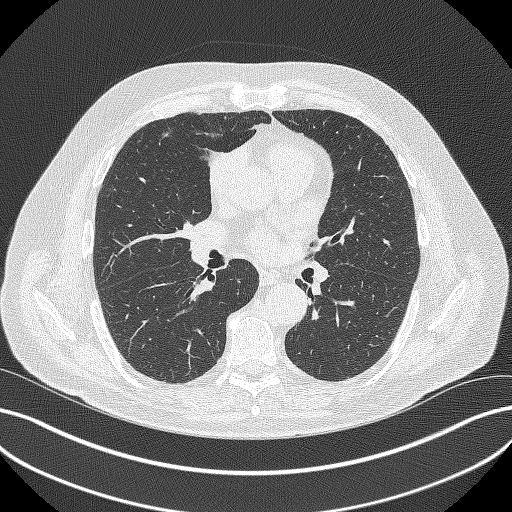
[im 221/348  lung]
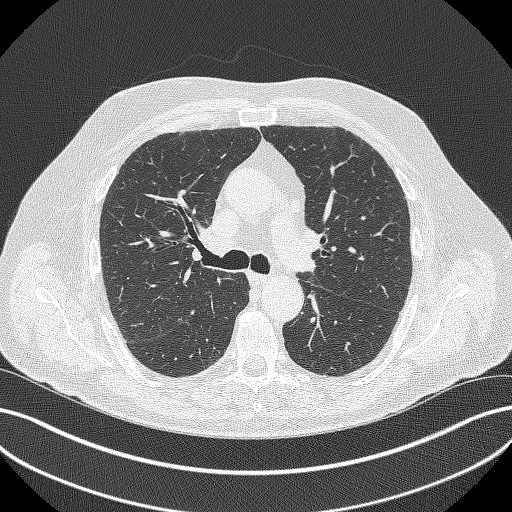
[im 237/348  mediastinal]
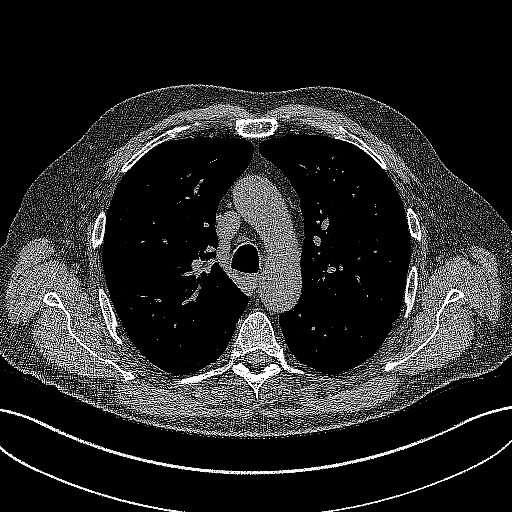
[im 237/348  lung]
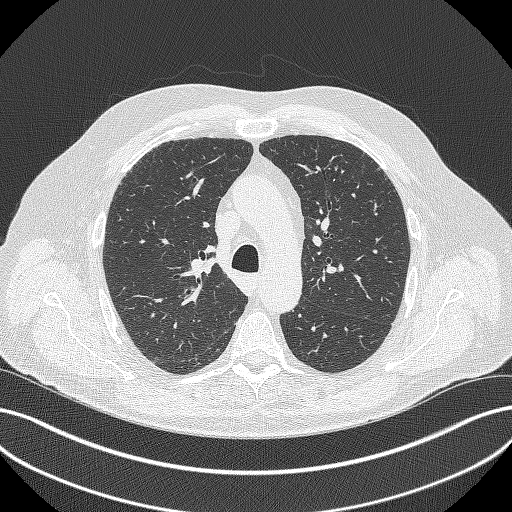
[im 269/348  lung]
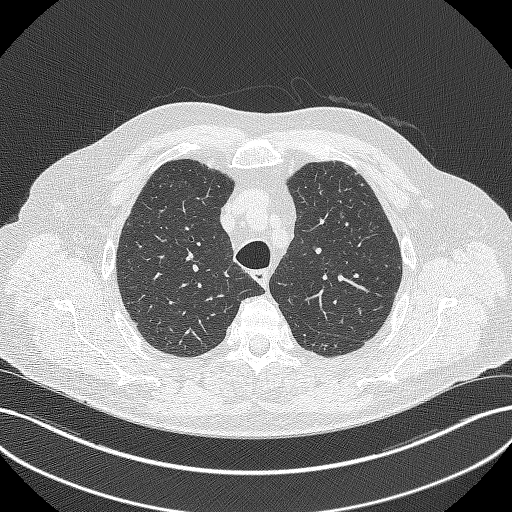
[im 300/348  lung]
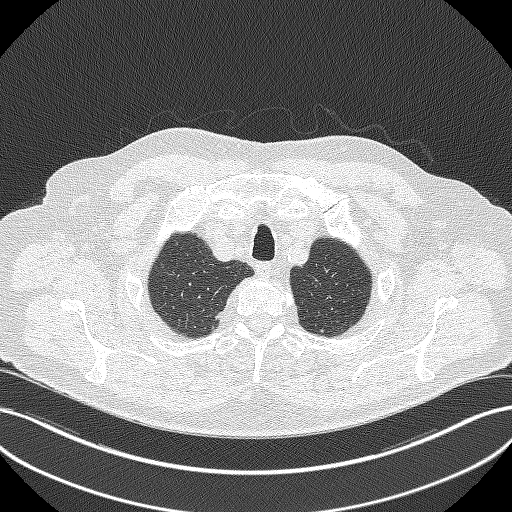
[im 332/348  lung]
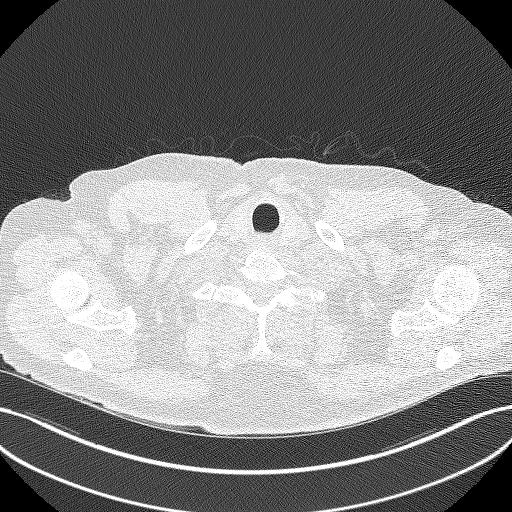

[15 of 40 positions shown; findings below may reference images not displayed]

FINDINGS: Cardiovascular: Heart size is normal. There is no significant
pericardial fluid, thickening or pericardial calcification. There is
aortic atherosclerosis, as well as atherosclerosis of the great
vessels of the mediastinum and the coronary arteries, including
calcified atherosclerotic plaque in the left main, left anterior
descending and right coronary arteries.

Mediastinum/Nodes: No pathologically enlarged mediastinal or hilar
lymph nodes. Please note that accurate exclusion of hilar adenopathy
is limited on noncontrast CT scans. Esophagus is unremarkable in
appearance. No axillary lymphadenopathy.

Lungs/Pleura: Multiple small pulmonary nodules are noted throughout
the lungs bilaterally, largest of which is a sub solid lesion in the
left lower lobe (axial image 215 of series 3), with a volume derived
mean diameter of 3.6 mm. No larger more suspicious appearing
pulmonary nodules or masses are noted. No acute consolidative
airspace disease. No pleural effusions. Mild diffuse bronchial wall
thickening with mild centrilobular and paraseptal emphysema.

Upper Abdomen: Aortic atherosclerosis.

Musculoskeletal: There are no aggressive appearing lytic or blastic
lesions noted in the visualized portions of the skeleton.
IMPRESSION: 1. Lung-RADS 2S, benign appearance or behavior. Continue annual
screening with low-dose chest CT without contrast in 12 months.
2. The "S" modifier above refers to potentially clinically
significant non lung cancer related findings. Specifically, there is
aortic atherosclerosis, in addition to left main and 2 vessel
coronary artery disease. Assessment for potential risk factor
modification, dietary therapy or pharmacologic therapy may be
warranted, if clinically indicated.
3. Mild diffuse bronchial wall thickening with mild centrilobular
and paraseptal emphysema; imaging findings suggestive of underlying
COPD.

Aortic Atherosclerosis (8VSJL-US5.5) and Emphysema (8VSJL-WLK.8).

## 2020-02-07 DEATH — deceased

## 2020-07-13 DIAGNOSIS — M9901 Segmental and somatic dysfunction of cervical region: Secondary | ICD-10-CM | POA: Diagnosis not present

## 2020-07-13 DIAGNOSIS — M62830 Muscle spasm of back: Secondary | ICD-10-CM | POA: Diagnosis not present

## 2020-07-13 DIAGNOSIS — M5126 Other intervertebral disc displacement, lumbar region: Secondary | ICD-10-CM | POA: Diagnosis not present

## 2020-07-13 DIAGNOSIS — M9903 Segmental and somatic dysfunction of lumbar region: Secondary | ICD-10-CM | POA: Diagnosis not present

## 2020-07-13 DIAGNOSIS — M9902 Segmental and somatic dysfunction of thoracic region: Secondary | ICD-10-CM | POA: Diagnosis not present

## 2020-07-17 DIAGNOSIS — M9903 Segmental and somatic dysfunction of lumbar region: Secondary | ICD-10-CM | POA: Diagnosis not present

## 2020-07-17 DIAGNOSIS — M9901 Segmental and somatic dysfunction of cervical region: Secondary | ICD-10-CM | POA: Diagnosis not present

## 2020-07-17 DIAGNOSIS — M9902 Segmental and somatic dysfunction of thoracic region: Secondary | ICD-10-CM | POA: Diagnosis not present

## 2020-07-20 DIAGNOSIS — M9902 Segmental and somatic dysfunction of thoracic region: Secondary | ICD-10-CM | POA: Diagnosis not present

## 2020-07-20 DIAGNOSIS — M9901 Segmental and somatic dysfunction of cervical region: Secondary | ICD-10-CM | POA: Diagnosis not present

## 2020-07-20 DIAGNOSIS — M9903 Segmental and somatic dysfunction of lumbar region: Secondary | ICD-10-CM | POA: Diagnosis not present

## 2020-07-24 DIAGNOSIS — M62830 Muscle spasm of back: Secondary | ICD-10-CM | POA: Diagnosis not present

## 2020-07-24 DIAGNOSIS — M5126 Other intervertebral disc displacement, lumbar region: Secondary | ICD-10-CM | POA: Diagnosis not present

## 2020-07-24 DIAGNOSIS — M9901 Segmental and somatic dysfunction of cervical region: Secondary | ICD-10-CM | POA: Diagnosis not present

## 2020-07-24 DIAGNOSIS — M9902 Segmental and somatic dysfunction of thoracic region: Secondary | ICD-10-CM | POA: Diagnosis not present

## 2020-10-11 DIAGNOSIS — Z125 Encounter for screening for malignant neoplasm of prostate: Secondary | ICD-10-CM | POA: Diagnosis not present

## 2020-10-11 DIAGNOSIS — E785 Hyperlipidemia, unspecified: Secondary | ICD-10-CM | POA: Diagnosis not present

## 2020-10-18 DIAGNOSIS — E669 Obesity, unspecified: Secondary | ICD-10-CM | POA: Diagnosis not present

## 2020-10-18 DIAGNOSIS — R82998 Other abnormal findings in urine: Secondary | ICD-10-CM | POA: Diagnosis not present

## 2020-10-18 DIAGNOSIS — R69 Illness, unspecified: Secondary | ICD-10-CM | POA: Diagnosis not present

## 2020-10-18 DIAGNOSIS — R197 Diarrhea, unspecified: Secondary | ICD-10-CM | POA: Diagnosis not present

## 2020-10-18 DIAGNOSIS — E785 Hyperlipidemia, unspecified: Secondary | ICD-10-CM | POA: Diagnosis not present

## 2020-10-18 DIAGNOSIS — Z6829 Body mass index (BMI) 29.0-29.9, adult: Secondary | ICD-10-CM | POA: Diagnosis not present

## 2020-10-18 DIAGNOSIS — Z0000 Encounter for general adult medical examination without abnormal findings: Secondary | ICD-10-CM | POA: Diagnosis not present

## 2020-10-18 DIAGNOSIS — R3915 Urgency of urination: Secondary | ICD-10-CM | POA: Diagnosis not present

## 2020-11-08 ENCOUNTER — Other Ambulatory Visit: Payer: Self-pay | Admitting: Internal Medicine

## 2020-11-08 DIAGNOSIS — F17200 Nicotine dependence, unspecified, uncomplicated: Secondary | ICD-10-CM

## 2020-11-14 DIAGNOSIS — Z1212 Encounter for screening for malignant neoplasm of rectum: Secondary | ICD-10-CM | POA: Diagnosis not present

## 2020-11-29 ENCOUNTER — Ambulatory Visit: Payer: Medicare HMO

## 2021-03-30 DIAGNOSIS — L500 Allergic urticaria: Secondary | ICD-10-CM | POA: Diagnosis not present

## 2021-07-02 DIAGNOSIS — S81802A Unspecified open wound, left lower leg, initial encounter: Secondary | ICD-10-CM | POA: Diagnosis not present

## 2021-07-09 DIAGNOSIS — Z23 Encounter for immunization: Secondary | ICD-10-CM | POA: Diagnosis not present

## 2021-07-09 DIAGNOSIS — S81809A Unspecified open wound, unspecified lower leg, initial encounter: Secondary | ICD-10-CM | POA: Diagnosis not present

## 2021-10-31 DIAGNOSIS — Z125 Encounter for screening for malignant neoplasm of prostate: Secondary | ICD-10-CM | POA: Diagnosis not present

## 2021-10-31 DIAGNOSIS — E785 Hyperlipidemia, unspecified: Secondary | ICD-10-CM | POA: Diagnosis not present

## 2021-11-07 DIAGNOSIS — Z0000 Encounter for general adult medical examination without abnormal findings: Secondary | ICD-10-CM | POA: Diagnosis not present

## 2021-11-07 DIAGNOSIS — R059 Cough, unspecified: Secondary | ICD-10-CM | POA: Diagnosis not present

## 2021-11-07 DIAGNOSIS — Z1339 Encounter for screening examination for other mental health and behavioral disorders: Secondary | ICD-10-CM | POA: Diagnosis not present

## 2021-11-07 DIAGNOSIS — R3915 Urgency of urination: Secondary | ICD-10-CM | POA: Diagnosis not present

## 2021-11-07 DIAGNOSIS — R251 Tremor, unspecified: Secondary | ICD-10-CM | POA: Diagnosis not present

## 2021-11-07 DIAGNOSIS — E785 Hyperlipidemia, unspecified: Secondary | ICD-10-CM | POA: Diagnosis not present

## 2021-11-07 DIAGNOSIS — F17200 Nicotine dependence, unspecified, uncomplicated: Secondary | ICD-10-CM | POA: Diagnosis not present

## 2021-11-07 DIAGNOSIS — Z1331 Encounter for screening for depression: Secondary | ICD-10-CM | POA: Diagnosis not present

## 2021-11-07 DIAGNOSIS — R69 Illness, unspecified: Secondary | ICD-10-CM | POA: Diagnosis not present

## 2021-11-07 DIAGNOSIS — E669 Obesity, unspecified: Secondary | ICD-10-CM | POA: Diagnosis not present

## 2021-11-08 ENCOUNTER — Other Ambulatory Visit: Payer: Self-pay | Admitting: Internal Medicine

## 2021-11-08 DIAGNOSIS — F17200 Nicotine dependence, unspecified, uncomplicated: Secondary | ICD-10-CM

## 2021-11-08 DIAGNOSIS — Z0000 Encounter for general adult medical examination without abnormal findings: Secondary | ICD-10-CM

## 2021-12-04 ENCOUNTER — Ambulatory Visit
Admission: RE | Admit: 2021-12-04 | Discharge: 2021-12-04 | Disposition: A | Discharge status: Home / Self Care | Payer: Medicare HMO | Source: Ambulatory Visit | Attending: Internal Medicine | Admitting: Internal Medicine

## 2021-12-04 ENCOUNTER — Other Ambulatory Visit: Payer: Self-pay

## 2021-12-04 DIAGNOSIS — I2510 Atherosclerotic heart disease of native coronary artery without angina pectoris: Secondary | ICD-10-CM | POA: Diagnosis not present

## 2021-12-04 DIAGNOSIS — F17210 Nicotine dependence, cigarettes, uncomplicated: Secondary | ICD-10-CM | POA: Diagnosis not present

## 2021-12-04 DIAGNOSIS — R69 Illness, unspecified: Secondary | ICD-10-CM | POA: Diagnosis not present

## 2021-12-04 DIAGNOSIS — Z0000 Encounter for general adult medical examination without abnormal findings: Secondary | ICD-10-CM

## 2021-12-04 DIAGNOSIS — F17200 Nicotine dependence, unspecified, uncomplicated: Secondary | ICD-10-CM

## 2021-12-04 DIAGNOSIS — J432 Centrilobular emphysema: Secondary | ICD-10-CM | POA: Diagnosis not present

## 2021-12-04 DIAGNOSIS — J929 Pleural plaque without asbestos: Secondary | ICD-10-CM | POA: Diagnosis not present

## 2022-01-27 DIAGNOSIS — M25521 Pain in right elbow: Secondary | ICD-10-CM | POA: Diagnosis not present

## 2022-07-23 DIAGNOSIS — Z0100 Encounter for examination of eyes and vision without abnormal findings: Secondary | ICD-10-CM | POA: Diagnosis not present

## 2022-07-23 DIAGNOSIS — H52229 Regular astigmatism, unspecified eye: Secondary | ICD-10-CM | POA: Diagnosis not present

## 2022-09-09 DIAGNOSIS — K580 Irritable bowel syndrome with diarrhea: Secondary | ICD-10-CM | POA: Diagnosis not present

## 2022-09-09 DIAGNOSIS — R197 Diarrhea, unspecified: Secondary | ICD-10-CM | POA: Diagnosis not present

## 2022-10-23 DIAGNOSIS — Z800 Family history of malignant neoplasm of digestive organs: Secondary | ICD-10-CM | POA: Diagnosis not present

## 2022-10-23 DIAGNOSIS — Z86010 Personal history of colonic polyps: Secondary | ICD-10-CM | POA: Diagnosis not present

## 2022-10-23 DIAGNOSIS — R197 Diarrhea, unspecified: Secondary | ICD-10-CM | POA: Diagnosis not present

## 2022-10-23 DIAGNOSIS — R1310 Dysphagia, unspecified: Secondary | ICD-10-CM | POA: Diagnosis not present

## 2022-10-25 DIAGNOSIS — K640 First degree hemorrhoids: Secondary | ICD-10-CM | POA: Diagnosis not present

## 2022-10-25 DIAGNOSIS — K31A14 Gastric intestinal metaplasia without dysplasia, involving the cardia: Secondary | ICD-10-CM | POA: Diagnosis not present

## 2022-10-25 DIAGNOSIS — R1319 Other dysphagia: Secondary | ICD-10-CM | POA: Diagnosis not present

## 2022-10-25 DIAGNOSIS — K2270 Barrett's esophagus without dysplasia: Secondary | ICD-10-CM | POA: Diagnosis not present

## 2022-10-25 DIAGNOSIS — Z86010 Personal history of colonic polyps: Secondary | ICD-10-CM | POA: Diagnosis not present

## 2022-10-25 DIAGNOSIS — K222 Esophageal obstruction: Secondary | ICD-10-CM | POA: Diagnosis not present

## 2022-10-25 DIAGNOSIS — Z800 Family history of malignant neoplasm of digestive organs: Secondary | ICD-10-CM | POA: Diagnosis not present

## 2022-10-25 DIAGNOSIS — R197 Diarrhea, unspecified: Secondary | ICD-10-CM | POA: Diagnosis not present

## 2022-10-25 DIAGNOSIS — K6389 Other specified diseases of intestine: Secondary | ICD-10-CM | POA: Diagnosis not present

## 2022-10-25 DIAGNOSIS — K3189 Other diseases of stomach and duodenum: Secondary | ICD-10-CM | POA: Diagnosis not present

## 2022-10-25 DIAGNOSIS — K5730 Diverticulosis of large intestine without perforation or abscess without bleeding: Secondary | ICD-10-CM | POA: Diagnosis not present

## 2022-10-25 DIAGNOSIS — K208 Other esophagitis without bleeding: Secondary | ICD-10-CM | POA: Diagnosis not present

## 2022-10-25 DIAGNOSIS — K2289 Other specified disease of esophagus: Secondary | ICD-10-CM | POA: Diagnosis not present

## 2022-11-15 DIAGNOSIS — Z125 Encounter for screening for malignant neoplasm of prostate: Secondary | ICD-10-CM | POA: Diagnosis not present

## 2022-11-15 DIAGNOSIS — E785 Hyperlipidemia, unspecified: Secondary | ICD-10-CM | POA: Diagnosis not present

## 2022-11-15 DIAGNOSIS — I700 Atherosclerosis of aorta: Secondary | ICD-10-CM | POA: Diagnosis not present

## 2022-11-22 DIAGNOSIS — I700 Atherosclerosis of aorta: Secondary | ICD-10-CM | POA: Diagnosis not present

## 2022-11-22 DIAGNOSIS — R3915 Urgency of urination: Secondary | ICD-10-CM | POA: Diagnosis not present

## 2022-11-22 DIAGNOSIS — E785 Hyperlipidemia, unspecified: Secondary | ICD-10-CM | POA: Diagnosis not present

## 2022-11-22 DIAGNOSIS — Z0000 Encounter for general adult medical examination without abnormal findings: Secondary | ICD-10-CM | POA: Diagnosis not present

## 2022-11-22 DIAGNOSIS — J439 Emphysema, unspecified: Secondary | ICD-10-CM | POA: Diagnosis not present

## 2022-11-22 DIAGNOSIS — K222 Esophageal obstruction: Secondary | ICD-10-CM | POA: Diagnosis not present

## 2022-11-22 DIAGNOSIS — Z1339 Encounter for screening examination for other mental health and behavioral disorders: Secondary | ICD-10-CM | POA: Diagnosis not present

## 2022-11-22 DIAGNOSIS — F17200 Nicotine dependence, unspecified, uncomplicated: Secondary | ICD-10-CM | POA: Diagnosis not present

## 2022-11-22 DIAGNOSIS — R159 Full incontinence of feces: Secondary | ICD-10-CM | POA: Diagnosis not present

## 2022-11-22 DIAGNOSIS — R69 Illness, unspecified: Secondary | ICD-10-CM | POA: Diagnosis not present

## 2022-11-22 DIAGNOSIS — E669 Obesity, unspecified: Secondary | ICD-10-CM | POA: Diagnosis not present

## 2022-11-22 DIAGNOSIS — Z1331 Encounter for screening for depression: Secondary | ICD-10-CM | POA: Diagnosis not present

## 2022-11-22 DIAGNOSIS — I2584 Coronary atherosclerosis due to calcified coronary lesion: Secondary | ICD-10-CM | POA: Diagnosis not present

## 2022-11-30 DIAGNOSIS — J029 Acute pharyngitis, unspecified: Secondary | ICD-10-CM | POA: Diagnosis not present

## 2022-11-30 DIAGNOSIS — U071 COVID-19: Secondary | ICD-10-CM | POA: Diagnosis not present
